# Patient Record
Sex: Female | Born: 1975 | Race: White | Hispanic: No | Marital: Single | Smoking: Never smoker
Health system: Southern US, Community
[De-identification: ages and names within clinical notes are randomized; demographics above are authoritative.]

## PROBLEM LIST (undated history)

## (undated) DIAGNOSIS — IMO0002 Reserved for concepts with insufficient information to code with codable children: Secondary | ICD-10-CM

## (undated) DIAGNOSIS — F32A Depression, unspecified: Secondary | ICD-10-CM

## (undated) DIAGNOSIS — F329 Major depressive disorder, single episode, unspecified: Secondary | ICD-10-CM

## (undated) DIAGNOSIS — I10 Essential (primary) hypertension: Secondary | ICD-10-CM

## (undated) DIAGNOSIS — F419 Anxiety disorder, unspecified: Secondary | ICD-10-CM

## (undated) DIAGNOSIS — N920 Excessive and frequent menstruation with regular cycle: Secondary | ICD-10-CM

## (undated) DIAGNOSIS — E669 Obesity, unspecified: Secondary | ICD-10-CM

## (undated) HISTORY — DX: Anxiety disorder, unspecified: F41.9

## (undated) HISTORY — PX: CYST EXCISION: SHX5701

## (undated) HISTORY — DX: Major depressive disorder, single episode, unspecified: F32.9

## (undated) HISTORY — DX: Reserved for concepts with insufficient information to code with codable children: IMO0002

## (undated) HISTORY — DX: Excessive and frequent menstruation with regular cycle: N92.0

## (undated) HISTORY — DX: Depression, unspecified: F32.A

## (undated) HISTORY — PX: ENDOMETRIAL ABLATION: SHX621

## (undated) HISTORY — DX: Essential (primary) hypertension: I10

## (undated) HISTORY — DX: Morbid (severe) obesity due to excess calories: E66.01

## (undated) HISTORY — DX: Obesity, unspecified: E66.9

## (undated) HISTORY — PX: LAPAROSCOPIC GASTRIC SLEEVE RESECTION: SHX5895

---

## 2012-07-29 HISTORY — PX: ENDOMETRIAL ABLATION: SHX621

## 2012-07-29 HISTORY — PX: TUBAL LIGATION: SHX77

## 2014-06-09 DIAGNOSIS — E668 Other obesity: Secondary | ICD-10-CM | POA: Insufficient documentation

## 2014-08-26 ENCOUNTER — Ambulatory Visit (INDEPENDENT_AMBULATORY_CARE_PROVIDER_SITE_OTHER): Payer: BLUE CROSS/BLUE SHIELD | Admitting: Advanced Practice Midwife

## 2014-08-26 ENCOUNTER — Encounter: Payer: Self-pay | Admitting: Advanced Practice Midwife

## 2014-08-26 VITALS — BP 127/85 | HR 96 | Resp 16 | Ht 62.0 in | Wt 231.0 lb

## 2014-08-26 DIAGNOSIS — Z124 Encounter for screening for malignant neoplasm of cervix: Secondary | ICD-10-CM

## 2014-08-26 DIAGNOSIS — Z01419 Encounter for gynecological examination (general) (routine) without abnormal findings: Secondary | ICD-10-CM

## 2014-08-26 DIAGNOSIS — Z8619 Personal history of other infectious and parasitic diseases: Secondary | ICD-10-CM

## 2014-08-26 DIAGNOSIS — E668 Other obesity: Secondary | ICD-10-CM

## 2014-08-26 DIAGNOSIS — Z1151 Encounter for screening for human papillomavirus (HPV): Secondary | ICD-10-CM

## 2014-08-26 NOTE — Patient Instructions (Signed)
Human Papillomavirus Human papillomavirus (HPV) is the most common sexually transmitted infection (STI) and is highly contagious. HPV infections cause genital warts and cancers to the outlet of the womb (cervix), birth canal (vagina), opening of the birth canal (vulva), and anus. There are over 100 types of HPV. Four types of HPV are responsible for causing 70% of all cervical cancers. Ninety percent of anal cancers and genital warts are caused by HPV. Unless you have wart-like lesions in the throat or genital warts that you can see or feel, HPV usually does not cause symptoms. Therefore, people can be infected for long periods and pass it on to others without knowing it. HPV in pregnancy usually does not cause a problem for the mother or baby. If the mother has genital warts, the baby rarely gets infected. When the HPV infection is found to be pre-cancerous on the cervix, vagina, or vulva, the mother will be followed closely during the pregnancy. Any needed treatment will be done after the baby is born. CAUSES   Having unprotected sex. HPV can be spread by oral, vaginal, or anal sexual activity.  Having several sex partners.  Having a sex partner who has other sex partners.  Having or having had another sexually transmitted infection. SYMPTOMS   More than 90% of people carrying HPV cannot tell anything is wrong.  Wart-like lesions in the throat (from having oral sex).  Warts in the infected skin or mucous membranes.  Genital warts may itch, burn, or bleed.  Genital warts may be painful or bleed during sexual intercourse. DIAGNOSIS   Genital warts are easily seen with the naked eye.  Currently, there is no FDA-approved test to detect HPV in males.  In females, a Pap test can show cells which are infected with HPV.  In females, a scope can be used to view the cervix (colposcopy). A colposcopy can be performed if the pelvic exam or Pap test is abnormal.  In females, a sample of tissue  may be removed (biopsy) during the colposcopy. TREATMENT   Treatment of genital warts can include:  Podophyllin lotion or gel.  Bichloroacetic acid (BCA) or trichloroacetic acid (TCA).  Podofilox solution or gel.  Imiquimod cream.  Interferon injections.  Use of a probe to apply extreme cold (cryotherapy).  Application of an intense beam of light (laser treatment).  Use of a probe to apply extreme heat (electrocautery).  Surgery.  HPV of the cervix, vagina, or vulva can be treated with:  Cryotherapy.  Laser treatment.  Electrocautery.  Surgery. Your caregiver will follow you closely after you are treated. This is because the HPV can come back and may need treatment again. HOME CARE INSTRUCTIONS   Follow your caregiver's instructions regarding medications, Pap tests, and follow-up exams.  Do not touch or scratch the warts.  Do not treat genital warts with medication used for treating hand warts.  Tell your sex partner about your infection because he or she may also need treatment.  Do not have sex while you are being treated.  After treatment, use condoms during sex to prevent future infections.  Have only 1 sex partner.  Have a sex partner who does not have other sex partners.  Use over-the-counter creams for itching or irritation as directed by your caregiver.  Use over-the-counter or prescription medicines for pain, discomfort, or fever as directed by your caregiver.  Do not douche or use tampons during treatment of HPV. PREVENTION   Talk to your caregiver about getting the HPV   vaccines. These vaccines prevent some HPV infections and cancers. It is recommended that the vaccine be given to males and females between the age of 26 and 17 years old. It will not work if you already have HPV and it is not recommended for pregnant women. The vaccines are not recommended for pregnant women.  Call your caregiver if you think you are pregnant and have the HPV.  A  PAP test is done to screen for cervical cancer.  The first PAP test should be done at age 31.  Between ages 29 and 65, PAP tests are repeated every 2 years.  Beginning at age 40, you are advised to have a PAP test every 3 years as long as your past 3 PAP tests have been normal.  Some women have medical problems that increase the chance of getting cervical cancer. Talk to your caregiver about these problems. It is especially important to talk to your caregiver if a new problem develops soon after your last PAP test. In these cases, your caregiver may recommend more frequent screening and Pap tests.  The above recommendations are the same for women who have or have not gotten the vaccine for HPV (Human Papillomavirus).  If you had a hysterectomy for a problem that was not a cancer or a condition that could lead to cancer, then you no longer need Pap tests. However, even if you no longer need a PAP test, a regular exam is a good idea to make sure no other problems are starting.   If you are between ages 25 and 71, and you have had normal Pap tests going back 10 years, you no longer need Pap tests. However, even if you no longer need a PAP test, a regular exam is a good idea to make sure no other problems are starting.  If you have had past treatment for cervical cancer or a condition that could lead to cancer, you need Pap tests and screening for cancer for at least 20 years after your treatment.  If Pap tests have been discontinued, risk factors (such as a new sexual partner)need to be re-assessed to determine if screening should be resumed.  Some women may need screenings more often if they are at high risk for cervical cancer. SEEK MEDICAL CARE IF:   The treated skin becomes red, swollen or painful.  You have an oral temperature above 102 F (38.9 C).  You feel generally ill.  You feel lumps or pimple-like projections in and around your genital area.  You develop bleeding of the  vagina or the treatment area.  You develop painful sexual intercourse. Document Released: 10/05/2003 Document Revised: 10/07/2011 Document Reviewed: 10/20/2013 [facility] Patient Information 2015 [place], [place]. This information is not intended to replace advice given to you by your health care provider. Make sure you discuss any questions you have with your health care provider.  Mammography Mammography is an X-ray of the breasts to look for changes that are not normal. The X-ray image is called a mammogram. This procedure can screen for breast cancer, can detect cancer early, and can diagnose cancer.  LET YOUR CAREGIVER KNOW ABOUT:  Breast implants.  Previous breast disease, biopsy, or surgery.  If you are breastfeeding.  Medicines taken, including vitamins, herbs, eyedrops, over-the-counter medicines, and creams.  Use of steroids (by mouth or creams).  Possibility of pregnancy, if this applies. RISKS AND COMPLICATIONS  Exposure to radiation, but at very low levels.  The results may be misinterpreted.  The  results may not be accurate.  Mammography may lead to further tests.  Mammography may not catch certain cancers. BEFORE THE PROCEDURE  Schedule your test about 7 days after your menstrual period. This is when your breasts are the least tender and have signs of hormone changes.  If you have had a mammography done at a different facility in the past, get the mammogram X-rays or have them sent to your current exam facility in order to compare them.  Wash your breasts and under your arms the day of the test.  Do not wear deodorants, perfumes, or powders anywhere on your body.  Wear clothes that you can change in and out of easily. PROCEDURE Relax as much as possible during the test. Any discomfort during the test will be very brief. The test should take less than 30 minutes. The following will happen:  You will undress from the waist up and put on a gown.  You will  stand in front of the X-ray machine.  Each breast will be placed between 2 plastic or glass plates. The plates will compress your breast for a few seconds.  X-rays will be taken from different angles of the breast. AFTER THE PROCEDURE  The mammogram will be examined.  Depending on the quality of the images, you may need to repeat certain parts of the test.  Ask when your test results will be ready. Make sure you get your test results.  You may resume normal activities. Document Released: 07/12/2000 Document Revised: 10/07/2011 Document Reviewed: 05/05/2011 [facility] Patient Information 2015 [place]. This information is not intended to replace advice given to you by your health care provider. Make sure you discuss any questions you have with your health care provider.

## 2014-08-27 NOTE — Progress Notes (Signed)
Patient ID: Krystal BoatmanAmber Perez, female   DOB: 01/10/1976, 39 y.o.   MRN: [id number]  HPI Krystal Perez is a 39 y.o. female. Here for annual Gyn exam and Pap. 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 no other history of abnormal Paps. No history of colposcopy or cervical procedures.    Has not had regular menstrual cycles since endometrial ablation. Only has occasional episodes of light spotting. Last episode of spotting 3 weeks ago.  Tubal ligation for birth control.  Planning bariatric surgery April 2016.  HPI Past Medical History  Diagnosis Date  . HPV test positive   . Menorrhagia     Past Surgical History  Procedure Laterality Date  . Endometrial ablation  2014  . Tubal ligation  2014    Family History  Problem Relation Age of Onset  . Hypertension Mother   . Diabetes Mother   . Hyperlipidemia Mother     Social History History  Substance Use Topics  . Smoking status: Never Smoker   . Smokeless tobacco: Not on file  . Alcohol Use: 0.0 oz/week    0 Not specified per week     Comment: Occasionally    No Known Allergies  Current Outpatient Prescriptions  Medication Sig Dispense Refill  . busPIRone (BUSPAR) 15 MG tablet TAKE 1 TABLET BY MOUTH TWICE DAILY AS NEEDED FOR ANXIETY    . citalopram (CELEXA) 20 MG tablet Take 20 mg by mouth.    . eszopiclone (LUNESTA) 2 MG TABS tablet Take 2 mg by mouth.    Marland Kitchen. ibuprofen (ADVIL,MOTRIN) 800 MG tablet TAKE 1 TABLET BY MOUTH EVERY 6 HOURS AS NEEDED FOR PAIN    . lisinopril (PRINIVIL,ZESTRIL) 10 MG tablet Take 10 mg by mouth.     No current facility-administered medications for this visit.    Review of Systems Review of Systems  Constitutional: Negative for appetite change, fatigue and unexpected weight change (has lost weight on purpose through changes in diet and increase exercise.).  Respiratory: Negative for shortness of breath.   Cardiovascular: Negative for chest pain and palpitations.  Gastrointestinal: Negative for abdominal pain,  diarrhea and constipation.  Endocrine: Negative for cold intolerance and heat intolerance.  Genitourinary: Negative for vaginal bleeding, vaginal discharge, menstrual problem and pelvic pain.    Blood pressure 127/85, pulse 96, resp. rate 16, height 5\' 2"  (1.575 m), weight 104.781 kg (231 lb).  Physical Exam Physical Exam  Constitutional: She is oriented to person, place, and time. She appears well-developed and well-nourished. No distress.  HENT:  Head: Normocephalic.  Eyes: Conjunctivae are normal.  Neck: Thyromegaly (? Slight thyromegaly, exam limited by body habitus) present.  Cardiovascular: Normal rate, regular rhythm and normal heart sounds.   No murmur heard. Pulmonary/Chest: Effort normal and breath sounds normal. No respiratory distress.  Abdominal: Soft. Bowel sounds are normal. She exhibits no distension and no mass. There is no tenderness. There is no rebound.  Genitourinary: Vagina normal and uterus normal. No vaginal discharge found.  Musculoskeletal: Normal range of motion. She exhibits no edema or tenderness.  Neurological: She is alert and oriented to person, place, and time.  Skin: Skin is warm and dry.  Psychiatric: She has a normal mood and affect.   Assessment/Plan 1. History of HPV infection  - Cytology - PAP with HPV co-testing  2. Screening for malignant neoplasm of cervix  - Cytology - PAP  3.  Obesity  - Recommend a TSH. Patient declines because she believes it will be drawn with  blood work scheduled at bariatric clinic. - Follow-up in 1 year HPV handout given   Dorathy Kinsman 08/27/2014, 2:01 PM

## 2014-08-30 LAB — CYTOLOGY - PAP

## 2014-10-05 DIAGNOSIS — Z903 Acquired absence of stomach [part of]: Secondary | ICD-10-CM | POA: Insufficient documentation

## 2017-05-30 ENCOUNTER — Ambulatory Visit: Payer: BLUE CROSS/BLUE SHIELD | Admitting: Physician Assistant

## 2017-06-02 ENCOUNTER — Ambulatory Visit (INDEPENDENT_AMBULATORY_CARE_PROVIDER_SITE_OTHER): Payer: 59 | Admitting: Physician Assistant

## 2017-06-02 ENCOUNTER — Encounter: Payer: Self-pay | Admitting: Physician Assistant

## 2017-06-02 VITALS — BP 133/85 | HR 81 | Temp 98.2°F | Resp 18 | Ht 62.0 in | Wt 179.2 lb

## 2017-06-02 DIAGNOSIS — Z130 Encounter for screening for diseases of the blood and blood-forming organs and certain disorders involving the immune mechanism: Secondary | ICD-10-CM | POA: Diagnosis not present

## 2017-06-02 DIAGNOSIS — Z1231 Encounter for screening mammogram for malignant neoplasm of breast: Secondary | ICD-10-CM

## 2017-06-02 DIAGNOSIS — Z23 Encounter for immunization: Secondary | ICD-10-CM | POA: Diagnosis not present

## 2017-06-02 DIAGNOSIS — Z7689 Persons encountering health services in other specified circumstances: Secondary | ICD-10-CM | POA: Diagnosis not present

## 2017-06-02 DIAGNOSIS — Z131 Encounter for screening for diabetes mellitus: Secondary | ICD-10-CM | POA: Diagnosis not present

## 2017-06-02 DIAGNOSIS — I10 Essential (primary) hypertension: Secondary | ICD-10-CM

## 2017-06-02 DIAGNOSIS — R002 Palpitations: Secondary | ICD-10-CM | POA: Diagnosis not present

## 2017-06-02 DIAGNOSIS — Z13220 Encounter for screening for lipoid disorders: Secondary | ICD-10-CM | POA: Diagnosis not present

## 2017-06-02 DIAGNOSIS — Z1329 Encounter for screening for other suspected endocrine disorder: Secondary | ICD-10-CM

## 2017-06-02 DIAGNOSIS — F418 Other specified anxiety disorders: Secondary | ICD-10-CM | POA: Diagnosis not present

## 2017-06-02 MED ORDER — BUSPIRONE HCL 5 MG PO TABS: 5.0000 mg | ORAL_TABLET | Freq: Two times a day (BID) | ORAL | 2 refills | Status: DC | PRN

## 2017-06-02 MED ORDER — FLUOXETINE HCL 20 MG PO TABS: 20.0000 mg | ORAL_TABLET | Freq: Every day | ORAL | 3 refills | Status: DC

## 2017-06-02 NOTE — Progress Notes (Signed)
HPI:                                                                Krystal Perez is a 41 y.o. female [facility] today to establish care  Depression/Anxiety: has been on Celexa and Fluoxetine in the past. Not currently taking any medication. Endorses depressed mood, anhedonia, over-eating, low self-esteem. Also reports increased anxiety, trouble relaxing and irritability. Denies sleep disturbance; typically falls asleep at 10pm and rises at 6:30am, no nighttime awakenings. She is currently single, raising 3 children, working full-time and going to school full-time. Denies symptoms of mania/hypomania. Denies suicidal thinking. Denies auditory/visual hallucinations. Previous medications: Lexapro, Celexa (side effects), Prozac (helpful), Xanax, Buspar, Lunesta  Patient reports concern about her heart rate for the last 1-2 months. She has noted that her resting HR has been 100-105 at times. Reports associated palpitations and feeling "shaky." Sometimes she feels anxious during these episodes. Palpitations last minutes to 1 hour. Denies chest pain with exertion, orthopnea, decreased exercise tolerance. She reports some dyspnea on exertion, but this is mainly at the beginning of jogging and improves in minutes. She is currently exercising 3-4 days/week for 1 hour. Has noted approximately 40 pound weight gain over the last 2 years; she had a sleeve gastrectomy 3 years ago. Denies family history of heart disease.   Past Medical History:  Diagnosis Date  . Anxiety   . Depression   . HPV test positive   . Menorrhagia   . Morbid obesity (HCC)   . Obesity    Past Surgical History:  Procedure Laterality Date  . CYST EXCISION    . ENDOMETRIAL ABLATION  2014  . ENDOMETRIAL ABLATION    . LAPAROSCOPIC GASTRIC SLEEVE RESECTION    . TUBAL LIGATION  2014   Social History   Tobacco Use  . Smoking status: Never Smoker  . Smokeless tobacco:  Never Used  Substance Use Topics  . Alcohol use: Yes    Alcohol/week: 0.0 oz    Comment: Occasionally   family history includes Cancer in her maternal grandfather; Diabetes in her maternal grandfather and mother; Hyperlipidemia in her mother; Hypertension in her father and mother.  ROS: Review of Systems  Constitutional: Negative.   HENT: Negative.   Eyes: Negative.   Respiratory: Negative for cough and wheezing.        + DOE  Cardiovascular: Positive for palpitations and leg swelling (left > right). Negative for chest pain, claudication and PND.  Gastrointestinal: Negative.   Genitourinary: Negative.   Musculoskeletal: Negative.   Skin: Negative.   Neurological: Negative.   Endo/Heme/Allergies:       + polyphagia  Psychiatric/Behavioral: Positive for depression. The patient is nervous/anxious. The patient does not have insomnia.      Medications: Current Outpatient Medications  Medication Sig Dispense Refill  . lisinopril (PRINIVIL,ZESTRIL) 10 MG tablet Take 10 mg by mouth.     No current facility-administered medications for this visit.    No Known Allergies     Objective:  BP 136/83   Pulse 87   Temp 98.2 F (36.8 C)   Resp 18   Ht 5\' 2"  (1.575 m)   Wt 179 lb 4 oz (81.3 kg)   SpO2  100%   BMI 32.79 kg/m  Gen:  alert, not ill-appearing, no distress, appropriate for age, obese female HEENT: head normocephalic without obvious abnormality, conjunctiva and cornea clear, trachea midline Pulm: Normal work of breathing, normal phonation, clear to auscultation bilaterally, no wheezes, rales or rhonchi CV: Normal rate, regular rhythm, s1 and s2 distinct, no murmurs, clicks or rubs  Neuro: alert and oriented x 3, no tremor MSK: extremities atraumatic, normal gait and station Skin: intact, no rashes on exposed skin, no jaundice, no cyanosis Psych: well-groomed, cooperative, good eye contact, euthymic mood, affect mood-congruent, speech is articulate, and thought  processes clear and goal-directed  Depression screen PHQ 2/9 06/02/2017  Decreased Interest 1  Down, Depressed, Hopeless 2  PHQ - 2 Score 3  Altered sleeping 1  Tired, decreased energy 1  Change in appetite 1  Feeling bad or failure about yourself  2  Trouble concentrating 1  Moving slowly or fidgety/restless 0  Suicidal thoughts 0  PHQ-9 Score 9   GAD 7 : Generalized Anxiety Score 06/02/2017  Nervous, Anxious, on Edge 3  Control/stop worrying 1  Worry too much - different things 1  Trouble relaxing 2  Restless 1  Easily annoyed or irritable 3  Afraid - awful might happen 0  Total GAD 7 Score 11     No results found for this or any previous visit (from the past 72 hour(s)). No results found.    Assessment and Plan: 41 y.o. female with   1. Encounter to establish care - reviewed PMH, PSH, PFH, medications and allergies - reviewed health maintenance - mammogram ordered today - Pap smear UTD - influenza given today  2. Mixed anxiety and depressive disorder - GAD7 11, moderate; PHQ9 score 9, moderate - starting Fluoxetine, per patient request for this medication. She finds xanax too sedating, so will trial Buspar for breakthrough anxiety. Follow-up in 4 weeks - FLUoxetine (PROZAC) 20 MG tablet; Take 1 tablet (20 mg total) daily by mouth.  Dispense: 30 tablet; Refill: 3 - busPIRone (BUSPAR) 5 MG tablet; Take 1 tablet (5 mg total) 2 (two) times daily as needed by mouth.  Dispense: 60 tablet; Refill: 2  3. Hypertension goal BP (blood pressure) < 130/80 BP Readings from Last 3 Encounters:  06/02/17 133/85  08/26/14 127/85  - counseled on therapeutic lifestyle changes - consider re-starting Lisinopril if other CVD risk factors or BP consistently elevated despite TLC   4. Screening for blood disease - CBC  5. Screening for diabetes mellitus - Comprehensive metabolic panel  6. Encounter for screening for lipid disorder - Lipid Panel w/reflex Direct LDL  7.  Screening for thyroid disorder - TSH - T3, free - T4, free  8. Intermittent palpitations - no tachycardia on exam today, checking labs to r/o anemia/thyroid disorder, limit caffeine, work on hydration, diary of episodes, manage anxiety, close follow-up in 4 weeks. Will consider referral to cardiology or cardiac event monitor if episodes persists despite interventions. Episodes do not have features of SVT  - CBC - TSH - T3, free - T4, free  9. Need for immunization against influenza - Flu Vaccine QUAD 36+ mos IM  10. Breast cancer screening by mammogram - MM SCREENING BREAST TOMO BILATERAL; Future  Patient education and anticipatory guidance given Patient agrees with treatment plan Follow-up in 4 weeks or sooner as needed if symptoms worsen or fail to improve  Levonne Hubert PA-C

## 2017-06-02 NOTE — Patient Instructions (Addendum)
For anxiety/mood: - Start Fluoxetine 1/2 tab daily for 3 days, then increase to full tab daily - Start Buspar 1 tablet two times daily as needed for anxiety - Follow-up in 4 weeks  For your blood pressure: - Check blood pressure at home for the next 2 weeks - Check around the same time each day in a relaxed setting - Limit salt to <2000 mg/day - Follow DASH eating plan - limit alcohol to 1 standard drink per day - avoid tobacco products - weight loss: 7% of current body weight  For palpitations: - limit caffeinated beverages to 1 cup per day - make sure you are drinking at least 1L of water daily; make sure you stay hydrated during and following exercise - keep a diary of your episodes. Include context, what you ate/drank, how long it lasted, anything that helped to relieve it  Living With Anxiety After being diagnosed with an anxiety disorder, you may be relieved to know why you have felt or behaved a certain way. It is natural to also feel overwhelmed about the treatment ahead and what it will mean for your life. With care and support, you can manage this condition and recover from it. How to cope with anxiety Dealing with stress Stress is your body's reaction to life changes and events, both good and bad. Stress can last just a few hours or it can be ongoing. Stress can play a major role in anxiety, so it is important to learn both how to cope with stress and how to think about it differently. Talk with your health care provider or a counselor to learn more about stress reduction. He or she may suggest some stress reduction techniques, such as:  Music therapy. This can include creating or listening to music that you enjoy and that inspires you.  Mindfulness-based meditation. This involves being aware of your normal breaths, rather than trying to control your breathing. It can be done while sitting or walking.  Centering prayer. This is a kind of meditation that involves focusing on a  word, phrase, or sacred image that is meaningful to you and that brings you peace.  Deep breathing. To do this, expand your stomach and inhale slowly through your nose. Hold your breath for 3-5 seconds. Then exhale slowly, allowing your stomach muscles to relax.  Self-talk. This is a skill where you identify thought patterns that lead to anxiety reactions and correct those thoughts.  Muscle relaxation. This involves tensing muscles then relaxing them.  Choose a stress reduction technique that fits your lifestyle and personality. Stress reduction techniques take time and practice. Set aside 5-15 minutes a day to do them. Therapists can offer training in these techniques. The training may be covered by some insurance plans. Other things you can do to manage stress include:  Keeping a stress diary. This can help you learn what triggers your stress and ways to control your response.  Thinking about how you respond to certain situations. You may not be able to control everything, but you can control your reaction.  Making time for activities that help you relax, and not feeling guilty about spending your time in this way.  Therapy combined with coping and stress-reduction skills provides the best chance for successful treatment. Medicines Medicines can help ease symptoms. Medicines for anxiety include:  Anti-anxiety drugs.  Antidepressants.  Beta-blockers.  Medicines may be used as the main treatment for anxiety disorder, along with therapy, or if other treatments are not working. Medicines should  be prescribed by a health care provider. Relationships Relationships can play a big part in helping you recover. Try to spend more time connecting with trusted friends and family members. Consider going to couples counseling, taking family education classes, or going to family therapy. Therapy can help you and others better understand the condition. How to recognize changes in your  condition Everyone has a different response to treatment for anxiety. Recovery from anxiety happens when symptoms decrease and stop interfering with your daily activities at home or work. This may mean that you will start to:  Have better concentration and focus.  Sleep better.  Be less irritable.  Have more energy.  Have improved memory.  It is important to recognize when your condition is getting worse. Contact your health care provider if your symptoms interfere with home or work and you do not feel like your condition is improving. Where to find help and support: You can get help and support from these sources:  Self-help groups.  Online and [employer] organizations.  A trusted spiritual leader.  Couples counseling.  Family education classes.  Family therapy.  Follow these instructions at home:  Eat a healthy diet that includes plenty of vegetables, fruits, whole grains, low-fat dairy products, and lean protein. Do not eat a lot of foods that are high in solid fats, added sugars, or salt.  Exercise. Most adults should do the following: ? Exercise for at least 150 minutes each week. The exercise should increase your heart rate and make you sweat (moderate-intensity exercise). ? Strengthening exercises at least twice a week.  Cut down on caffeine, tobacco, alcohol, and other potentially harmful substances.  Get the right amount and quality of sleep. Most adults need 7-9 hours of sleep each night.  Make choices that simplify your life.  Take over-the-counter and prescription medicines only as told by your health care provider.  Avoid caffeine, alcohol, and certain over-the-counter cold medicines. These may make you feel worse. Ask your pharmacist which medicines to avoid.  Keep all follow-up visits as told by your health care provider. This is important. Questions to ask your health care provider  Would I benefit from therapy?  How often should I follow up with a  health care provider?  How long do I need to take medicine?  Are there any long-term side effects of my medicine?  Are there any alternatives to taking medicine? Contact a health care provider if:  You have a hard time staying focused or finishing daily tasks.  You spend many hours a day feeling worried about everyday life.  You become exhausted by worry.  You start to have headaches, feel tense, or have nausea.  You urinate more than normal.  You have diarrhea. Get help right away if:  You have a racing heart and shortness of breath.  You have thoughts of hurting yourself or others. If you ever feel like you may hurt yourself or others, or have thoughts about taking your own life, get help right away. You can go to your nearest emergency department or call:  Your local emergency services (911 in the [place]  A suicide crisis helpline, such as the National Suicide Prevention Lifeline at 704-816-72591-312-532-9472. This is open 24-hours a day.  Summary  Taking steps to deal with stress can help calm you.  Medicines cannot cure anxiety disorders, but they can help ease symptoms.  Family, friends, and partners can play a big part in helping you recover from an anxiety disorder. This information  is not intended to replace advice given to you by your health care provider. Make sure you discuss any questions you have with your health care provider. Document Released: 07/09/2016 Document Revised: 07/09/2016 Document Reviewed: 07/09/2016 Elsevier Interactive Patient Education  [employer].

## 2017-07-02 ENCOUNTER — Encounter: Payer: 59 | Admitting: Physician Assistant

## 2017-07-02 DIAGNOSIS — Z0189 Encounter for other specified special examinations: Secondary | ICD-10-CM

## 2017-07-03 ENCOUNTER — Ambulatory Visit: Payer: 59

## 2017-07-10 ENCOUNTER — Telehealth: Payer: Self-pay | Admitting: *Deleted

## 2017-07-10 NOTE — Telephone Encounter (Signed)
Patient called and states the Buspar does not seem to help at all with as needed anxiety. She and the provider had discussed taking a medication for as needed use for anxiety that would be less likely to cause drowsiness. Patient is overdue for her four week follow appointment for this issue. She does have an appointment scheduled in Jan for her CPE. I did advise her that a separate appointment should be scheduled to discuss this current issue as insurance will not pay for both the CPE and other problems discussed.Patient voiced understanding. She wasn't sure if she would be able to come in before her scheduled appointment in January. Please advise

## 2017-07-10 NOTE — Telephone Encounter (Signed)
Is she taking the Fluoxetine? I would recommend increasing this to 40 mg daily As far as the Buspar, she is on the lowest possible dose. I would increase her to 2 tablets up to 3 times daily for breakthrough anxiety There are not any as needed medications that don't have the potential to be sedating If she can come in for an appointment we can discuss a beta blocker, but I need to see her vital signs and take a cardiac history before doing this. These are the best recommendations I can make without seeing her in the office.

## 2017-07-11 NOTE — Telephone Encounter (Signed)
Called and left message for patient to call back.

## 2017-08-13 ENCOUNTER — Encounter: Payer: 59 | Admitting: Physician Assistant

## 2017-08-13 ENCOUNTER — Ambulatory Visit: Payer: 59

## 2017-10-07 ENCOUNTER — Encounter: Payer: Self-pay | Admitting: Physician Assistant

## 2017-10-07 ENCOUNTER — Ambulatory Visit (INDEPENDENT_AMBULATORY_CARE_PROVIDER_SITE_OTHER): Payer: 59 | Admitting: Physician Assistant

## 2017-10-07 VITALS — BP 121/81 | HR 80 | Wt 175.0 lb

## 2017-10-07 DIAGNOSIS — R002 Palpitations: Secondary | ICD-10-CM | POA: Diagnosis not present

## 2017-10-07 DIAGNOSIS — Z23 Encounter for immunization: Secondary | ICD-10-CM | POA: Diagnosis not present

## 2017-10-07 DIAGNOSIS — F418 Other specified anxiety disorders: Secondary | ICD-10-CM

## 2017-10-07 MED ORDER — FLUOXETINE HCL 20 MG PO TABS: 20.0000 mg | ORAL_TABLET | Freq: Every day | ORAL | 1 refills | Status: DC

## 2017-10-07 MED ORDER — BUSPIRONE HCL 10 MG PO TABS: 10.0000 mg | ORAL_TABLET | Freq: Three times a day (TID) | ORAL | 3 refills | Status: DC | PRN

## 2017-10-07 MED ORDER — ATENOLOL 25 MG PO TABS: 25.0000 mg | ORAL_TABLET | Freq: Every day | ORAL | 1 refills | Status: DC

## 2017-10-07 NOTE — Progress Notes (Signed)
HPI:                                                                Krystal Perez is a 42 y.o. female who presents to [name]: [facility] today for medication management  Depression/Anxiety: she was started on Fluoxetine and Buspar prn approximately 4 months ago. She contacted the office requesting a medication change because she did not feel the Buspar was working. She is currently taking Fluoxetine 20 mg at nighttime. Reports parenting is her main stressor and anxiety seems to be worse in the evenings. She has 2 daughters living with her and 2 sons living with their father. Denies symptoms of mania/hypomania. Denies suicidal thinking. Denies auditory/visual hallucinations. Prior meds for sleep: Melatonin - weird dreams, Zquil - chest discomfort  Depression screen [facility] 2/9 10/07/2017 06/02/2017  Decreased Interest 0 1  Down, Depressed, Hopeless 0 2  PHQ - 2 Score 0 3  Altered sleeping 1 1  Tired, decreased energy 1 1  Change in appetite 0 1  Feeling bad or failure about yourself  0 2  Trouble concentrating 2 1  Moving slowly or fidgety/restless 0 0  Suicidal thoughts 0 0  PHQ-9 Score 4 9  Difficult doing work/chores Somewhat difficult -    GAD 7 : Generalized Anxiety Score 10/07/2017 06/02/2017  Nervous, Anxious, on Edge 1 3  Control/stop worrying 1 1  Worry too much - different things 1 1  Trouble relaxing 1 2  Restless 1 1  Easily annoyed or irritable 1 3  Afraid - awful might happen 0 0  Total GAD 7 Score 6 11  Anxiety Difficulty Somewhat difficult -      Past Medical History:  Diagnosis Date  . Anxiety   . Depression   . HPV test positive   . Hypertension   . Menorrhagia   . Morbid obesity (HCC)   . Obesity    Past Surgical History:  Procedure Laterality Date  . CYST EXCISION    . ENDOMETRIAL ABLATION  2014  . ENDOMETRIAL ABLATION    . LAPAROSCOPIC GASTRIC SLEEVE RESECTION    . TUBAL LIGATION  2014   Social History    Tobacco Use  . Smoking status: Never Smoker  . Smokeless tobacco: Never Used  Substance Use Topics  . Alcohol use: Yes    Alcohol/week: 0.0 oz    Comment: Occasionally   family history includes Cancer in her maternal grandfather; Diabetes in her maternal grandfather and mother; Hyperlipidemia in her mother; Hypertension in her father and mother.    ROS: negative except as noted in the HPI  Medications: Current Outpatient Medications  Medication Sig Dispense Refill  . FLUoxetine (PROZAC) 20 MG tablet Take 1 tablet (20 mg total) by mouth daily. 90 tablet 1  . atenolol (TENORMIN) 25 MG tablet Take 1 tablet (25 mg total) by mouth daily. 90 tablet 1  . busPIRone (BUSPAR) 10 MG tablet Take 1 tablet (10 mg total) by mouth 3 (three) times daily as needed. 60 tablet 3   No current facility-administered medications for this visit.    Allergies  Allergen Reactions  . Celexa [Citalopram Hydrobromide] Other (See Comments)       Objective:  BP 121/81   Pulse 80  Wt 175 lb (79.4 kg)   LMP 09/30/2017   BMI 32.01 kg/m  Gen:  alert, not ill-appearing, no distress, appropriate for age, obese female HEENT: head normocephalic without obvious abnormality, conjunctiva and cornea clear, trachea midline Pulm: Normal work of breathing, normal phonation, clear to auscultation bilaterally, no wheezes, rales or rhonchi CV: Normal rate, regular rhythm, s1 and s2 distinct, no murmurs, clicks or rubs  Neuro: alert and oriented x 3, no tremor MSK: extremities atraumatic, normal gait and station Skin: intact, no rashes on exposed skin, no jaundice, no cyanosis Psych: well-groomed, cooperative, good eye contact, euthymic mood, affect mood-congruent, speech is articulate, and thought processes clear and goal-directed    No results found for this or any previous visit (from the past 72 hour(s)). No results found.    Assessment and Plan: 42 y.o. female with   1. 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 Having a good response to Fluoxetine. She is mainly concerned about over-sedation with anxiety medication. We will start a beta blocker at bedtime to try to control breakthrough anxiety - FLUoxetine (PROZAC) 20 MG tablet; Take 1 tablet (20 mg total) by mouth daily.  Dispense: 90 tablet; Refill: 1  2. Heart palpitations - atenolol (TENORMIN) 25 MG tablet; Take 1 tablet (25 mg total) by mouth daily.  Dispense: 90 tablet; Refill: 1   Mixed anxiety and depressive disorder - Plan: FLUoxetine (PROZAC) 20 MG tablet, busPIRone (BUSPAR) 10 MG tablet  Heart palpitations - Plan: atenolol (TENORMIN) 25 MG tablet  Need for Tdap vaccination - Plan: Tdap vaccine greater than or equal to 7yo IM    Patient education and anticipatory guidance given Patient agrees with treatment plan Follow-up in 3 months or sooner as needed if symptoms worsen or fail to improve  Levonne Hubertharley E. Georgi Tuel PA-C

## 2017-10-07 NOTE — Patient Instructions (Addendum)
Call [phone] to schedule mammogram   

## 2018-01-08 ENCOUNTER — Ambulatory Visit: Payer: 59 | Admitting: Physician Assistant

## 2018-03-25 ENCOUNTER — Other Ambulatory Visit: Payer: Self-pay | Admitting: Physician Assistant

## 2018-03-25 DIAGNOSIS — F418 Other specified anxiety disorders: Secondary | ICD-10-CM

## 2018-04-08 ENCOUNTER — Other Ambulatory Visit: Payer: Self-pay | Admitting: Physician Assistant

## 2018-04-08 DIAGNOSIS — F418 Other specified anxiety disorders: Secondary | ICD-10-CM

## 2018-04-27 ENCOUNTER — Ambulatory Visit (INDEPENDENT_AMBULATORY_CARE_PROVIDER_SITE_OTHER): Payer: 59 | Admitting: Physician Assistant

## 2018-04-27 ENCOUNTER — Encounter: Payer: Self-pay | Admitting: Physician Assistant

## 2018-04-27 ENCOUNTER — Ambulatory Visit (INDEPENDENT_AMBULATORY_CARE_PROVIDER_SITE_OTHER): Payer: 59

## 2018-04-27 VITALS — BP 115/74 | HR 91 | Wt 183.0 lb

## 2018-04-27 DIAGNOSIS — Z9884 Bariatric surgery status: Secondary | ICD-10-CM

## 2018-04-27 DIAGNOSIS — F418 Other specified anxiety disorders: Secondary | ICD-10-CM

## 2018-04-27 DIAGNOSIS — M25462 Effusion, left knee: Secondary | ICD-10-CM

## 2018-04-27 DIAGNOSIS — M25561 Pain in right knee: Secondary | ICD-10-CM | POA: Diagnosis not present

## 2018-04-27 DIAGNOSIS — M25562 Pain in left knee: Secondary | ICD-10-CM | POA: Diagnosis not present

## 2018-04-27 DIAGNOSIS — Z23 Encounter for immunization: Secondary | ICD-10-CM | POA: Diagnosis not present

## 2018-04-27 DIAGNOSIS — M1712 Unilateral primary osteoarthritis, left knee: Secondary | ICD-10-CM | POA: Diagnosis not present

## 2018-04-27 MED ORDER — ESCITALOPRAM OXALATE 10 MG PO TABS: ORAL_TABLET | ORAL | 0 refills | Status: DC

## 2018-04-27 MED ORDER — DICLOFENAC SODIUM 1 % TD GEL: 4.0000 g | Freq: Four times a day (QID) | TRANSDERMAL | 2 refills | Status: DC

## 2018-04-27 MED ORDER — LORAZEPAM 0.5 MG PO TABS: 0.5000 mg | ORAL_TABLET | Freq: Three times a day (TID) | ORAL | 0 refills | Status: DC | PRN

## 2018-04-27 NOTE — Progress Notes (Signed)
HPI:                                                                Jericho Cieslik is a 42 y.o. female who presents to [name]: Primary Care [facility] today for medication management  Depression/Anxiety: she self-discontinued her Fluoxetine in early June. Reports sexual side effects, specifically anorgasmia, with Fluoxetine. She has been feeling more depressed and tearful since stopping the medication. She exercises 4-5 days per week, but is gaining weight. She recently got married on June 30th. Denies symptoms of mania/hypomania. Denies suicidal thinking. Denies auditory/visual hallucinations.  Knee pain: reports bilateral knee pain, described as achey, associated with grinding sensation and "tightness," gradually worsening over the last 2 months. Worse with jumping and squats.  She has had to reduce her physical activity at the gym. She adjusted her exercise routine, avoided provoking activities, and started leg strengthening. She has not tried any anti-inflammatories due to s/p gastric sleeve.  Depression screen [facility] 2/9 04/27/2018 10/07/2017 06/02/2017  Decreased Interest 1 0 1  Down, Depressed, Hopeless 2 0 2  PHQ - 2 Score 3 0 3  Altered sleeping 2 1 1   Tired, decreased energy 3 1 1   Change in appetite 3 0 1  Feeling bad or failure about yourself  1 0 2  Trouble concentrating 2 2 1   Moving slowly or fidgety/restless 0 0 0  Suicidal thoughts 0 0 0  PHQ-9 Score 14 4 9   Difficult doing work/chores Somewhat difficult Somewhat difficult -    GAD 7 : Generalized Anxiety Score 04/27/2018 10/07/2017 06/02/2017  Nervous, Anxious, on Edge 3 1 3   Control/stop worrying 2 1 1   Worry too much - different things 2 1 1   Trouble relaxing 1 1 2   Restless 2 1 1   Easily annoyed or irritable 3 1 3   Afraid - awful might happen 2 0 0  Total GAD 7 Score 15 6 11   Anxiety Difficulty Somewhat difficult Somewhat difficult -      Past Medical History:  Diagnosis Date  .  Anxiety   . Depression   . HPV test positive   . Hypertension   . Menorrhagia   . Morbid obesity (HCC)   . Obesity    Past Surgical History:  Procedure Laterality Date  . CYST EXCISION    . ENDOMETRIAL ABLATION  2014  . ENDOMETRIAL ABLATION    . LAPAROSCOPIC GASTRIC SLEEVE RESECTION    . TUBAL LIGATION  2014   Social History   Tobacco Use  . Smoking status: Never Smoker  . Smokeless tobacco: Never Used  Substance Use Topics  . Alcohol use: Yes    Alcohol/week: 0.0 standard drinks    Comment: Occasionally   family history includes Cancer in her maternal grandfather; Diabetes in her maternal grandfather and mother; Hyperlipidemia in her mother; Hypertension in her father and mother.    ROS: negative except as noted in the HPI  Medications: Current Outpatient Medications  Medication Sig Dispense Refill  . escitalopram (LEXAPRO) 10 MG tablet Take 0.5 tablets (5 mg total) by mouth at bedtime for 3 days, THEN 1 tablet (10 mg total) at bedtime for 27 days. 30 tablet 0   No current facility-administered medications for this visit.  Allergies  Allergen Reactions  . Celexa [Citalopram Hydrobromide] Other (See Comments)  . Fluoxetine Other (See Comments)    Sexual side effects       Objective:  BP 115/74   Pulse 91   Wt 183 lb (83 kg)   BMI 33.47 kg/m  Gen:  alert, not ill-appearing, no distress, appropriate for age, obese female HEENT: head normocephalic without obvious abnormality, conjunctiva and cornea clear, trachea midline Pulm: Normal work of breathing, normal phonation, clear to auscultation bilaterally, no wheezes, rales or rhonchi CV: Normal rate, regular rhythm, s1 and s2 distinct, no murmurs, clicks or rubs  Neuro: alert and oriented x 3, no tremor MSK: extremities atraumatic, normal gait and station Knees: atraumatic, bilateral crepitus, no palpable effusion, ROM intact,  no joint line tenderness, pain reproduced with flexion, negative McMurrays Skin:  intact, no rashes on exposed skin, no jaundice, no cyanosis Psych: well-groomed, cooperative, good eye contact, depressed mood, affect full range, speech is articulate, and thought processes clear and goal-directed    No results found for this or any previous visit (from the past 72 hour(s)). No results found.    Assessment and Plan: 43 y.o. female with   .Zareen was seen today for medication management.  Diagnoses and all orders for this visit:  Mixed anxiety and depressive disorder -     escitalopram (LEXAPRO) 10 MG tablet; Take 0.5 tablets (5 mg total) by mouth at bedtime for 3 days, THEN 1 tablet (10 mg total) at bedtime for 27 days. -     LORazepam (ATIVAN) 0.5 MG tablet; Take 1 tablet (0.5 mg total) by mouth every 8 (eight) hours as needed for anxiety.  Pain in both knees, unspecified chronicity -     DG Knee Complete 4 Views Left -     DG Knee Complete 4 Views Right -     diclofenac sodium (VOLTAREN) 1 % GEL; Apply 4 g topically 4 (four) times daily. To affected joint. -     Ambulatory referral to Physical Therapy  Need for immunization against influenza -     Flu Vaccine QUAD 36+ mos IM  Patellofemoral arthralgia of left knee -     Ambulatory referral to Physical Therapy -     meloxicam (MOBIC) 15 MG tablet; Take 1 tablet (15 mg total) by mouth daily.  Status post gastric bypass for obesity -     famotidine (PEPCID) 40 MG tablet; Take daily with anti-inflammatory to prevent gastric ulcer     Patient education and anticipatory guidance given Patient agrees with treatment plan Follow-up in 1 month or sooner as needed if symptoms worsen or fail to improve  Levonne Hubert PA-C

## 2018-04-27 NOTE — Patient Instructions (Addendum)
Check with your insurance company for coverage of Trintell[employer]   Call [phone] to schedule your mammogram

## 2018-04-27 NOTE — Progress Notes (Signed)
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

## 2018-04-28 MED ORDER — MELOXICAM 15 MG PO TABS: 15.0000 mg | ORAL_TABLET | Freq: Every day | ORAL | 0 refills | Status: DC

## 2018-04-28 MED ORDER — FAMOTIDINE 40 MG PO TABS: ORAL_TABLET | ORAL | 0 refills | Status: DC

## 2018-05-07 ENCOUNTER — Encounter: Payer: Self-pay | Admitting: Physician Assistant

## 2018-05-07 DIAGNOSIS — Z9884 Bariatric surgery status: Secondary | ICD-10-CM | POA: Insufficient documentation

## 2018-05-07 DIAGNOSIS — M25562 Pain in left knee: Secondary | ICD-10-CM | POA: Insufficient documentation

## 2018-05-18 ENCOUNTER — Encounter: Payer: Self-pay | Admitting: Family Medicine

## 2018-05-18 ENCOUNTER — Ambulatory Visit (INDEPENDENT_AMBULATORY_CARE_PROVIDER_SITE_OTHER): Payer: 59 | Admitting: Family Medicine

## 2018-05-18 VITALS — BP 130/83 | HR 72 | Ht 62.0 in | Wt 182.0 lb

## 2018-05-18 DIAGNOSIS — M25562 Pain in left knee: Secondary | ICD-10-CM

## 2018-05-18 DIAGNOSIS — M25561 Pain in right knee: Secondary | ICD-10-CM

## 2018-05-18 DIAGNOSIS — M1712 Unilateral primary osteoarthritis, left knee: Secondary | ICD-10-CM | POA: Diagnosis not present

## 2018-05-18 DIAGNOSIS — F418 Other specified anxiety disorders: Secondary | ICD-10-CM

## 2018-05-18 DIAGNOSIS — Z903 Acquired absence of stomach [part of]: Secondary | ICD-10-CM

## 2018-05-18 MED ORDER — OMEPRAZOLE 20 MG PO CPDR: 20.0000 mg | DELAYED_RELEASE_CAPSULE | Freq: Every day | ORAL | 3 refills | Status: DC

## 2018-05-18 MED ORDER — DICLOFENAC SODIUM 1 % TD GEL: 4.0000 g | Freq: Four times a day (QID) | TRANSDERMAL | 12 refills | Status: DC

## 2018-05-18 MED ORDER — ESCITALOPRAM OXALATE 10 MG PO TABS: 10.0000 mg | ORAL_TABLET | Freq: Every day | ORAL | 0 refills | Status: DC

## 2018-05-18 NOTE — Progress Notes (Signed)
Subjective:    I'm seeing this patient as a consultation for:  Carlis Stable, PA-C   CC: Bilateral knee pain  HPI: Krystal Perez notes bilateral left worse than right knee pain ongoing for about 3 months.  She is status post gastric sleeve a few years ago and is exercising regularly and attempt to control weight and improve health.  She is been doing a kickboxing cardio class that requires lots of stepping and lunging.  She notes her knee has been bothering her since starting the class.  She does note the left knee will occasionally swell and is predominantly painful at the medial joint line.  She notes popping and clicking with knee motion bilaterally worse with deep knee bending and squatting.  She denies locking or catching.  She is tried some over-the-counter medications including ibuprofen and Aleve but is not supposed to take NSAIDs regularly due to her gastric sleeve surgery.  She was seen by her PCP who obtain x-rays and is here today for follow-up and evaluation after referral.  Her PCP recently started Lexapro and is currently taking 10 mg daily.  She notes she is feeling great on this and would like to continue it.  Past medical history, Surgical history, Family history not pertinant except as noted below, Social history, Allergies, and medications have been entered into the medical record, reviewed, and no changes needed.   Review of Systems: No headache, visual changes, nausea, vomiting, diarrhea, constipation, dizziness, abdominal pain, skin rash, fevers, chills, night sweats, weight loss, swollen lymph nodes, body aches, joint swelling, muscle aches, chest pain, shortness of breath, mood changes, visual or auditory hallucinations.   Objective:    Vitals:   05/18/18 1305  BP: 130/83  Pulse: 72   General: Well Developed, well nourished, and in no acute distress.  Neuro/Psych: Alert and oriented x3, extra-ocular muscles intact, able to move all 4 extremities, sensation  grossly intact.  Normal speech thought process and affect. Skin: Warm and dry, no rashes noted.  Respiratory: Not using accessory muscles, speaking in full sentences, trachea midline.  Cardiovascular: Pulses palpable, no extremity edema. Abdomen: Does not appear distended. MSK:  Left knee no significant effusion. Range of motion 0-120 degrees with retropatellar crepitations. Stable ligamentous exam. Mildly tender to palpation medial joint line. Mildly positive medial McMurray's test.  Right knee: No significant effusion range of motion 0-120 degrees with retropatellar crepitations. Stable ligamentous exam. Negative McMurray's testing.  Intact flexion and extension strength.  Hips bilaterally normal-appearing normal motion nontender hip abduction strength diminished left side 4/5 normal right 5/5.  Lab and Radiology Results EXAM: RIGHT KNEE - COMPLETE 4+ VIEW  COMPARISON:  None.  FINDINGS: Mild lateral spur formation and mild to moderate medial and patellofemoral spur formation. No effusion.  IMPRESSION: Degenerative changes, as described above.   Electronically Signed   By: Beckie Salts M.D.   On: 04/27/2018 16:00  EXAM: LEFT KNEE - COMPLETE 4+ VIEW  COMPARISON:  None.  FINDINGS: No fracture or malalignment. Mild patellofemoral degenerative change. Small knee effusion. Mild degenerative change of the medial joint space.  IMPRESSION: Mild arthritis with small knee effusion. No acute osseous abnormality.   Electronically Signed   By: Jasmine Pang M.D.   On: 04/27/2018 16:00 I personally (independently) visualized and performed the interpretation of the images attached in this note.   Impression and Recommendations:    Assessment and Plan: 42 y.o. female with  Knee pain bilateral.  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  Somewhat concerned for left knee medial meniscus injury.  Plan for diclofenac gel  modification of exercise and quad and hip abductor strength program.  Plan for recheck in 1 month sooner if needed.  Next step would be injection versus MRI.  Patient additionally is doing well from a mental health standpoint refill Lexapro follow-up with PCP.  Patient was taken over-the-counter omeprazole refilled prescription..   No orders of the defined types were placed in this encounter.  Meds ordered this encounter  Medications  . omeprazole (PRILOSEC) 20 MG capsule    Sig: Take 1 capsule (20 mg total) by mouth daily.    Dispense:  90 capsule    Refill:  3  . diclofenac sodium (VOLTAREN) 1 % GEL    Sig: Apply 4 g topically 4 (four) times daily. To affected joint.    Dispense:  100 g    Refill:  12    Tried Ibuprofen and aleve. Knee arthritis.  Marland Kitchen escitalopram (LEXAPRO) 10 MG tablet    Sig: Take 1 tablet (10 mg total) by mouth at bedtime.    Dispense:  90 tablet    Refill:  0    Discussed warning signs or symptoms. Please see discharge instructions. Patient expresses understanding.

## 2018-05-18 NOTE — Patient Instructions (Addendum)
Thank you for coming in today. Continue to exercise.  Cycling is ok.  Avoid deep step and lunges and squats.   Use the dicofenac gel.   Recheck in 1 month.  Let me know if not better.

## 2018-06-15 ENCOUNTER — Ambulatory Visit: Payer: 59 | Admitting: Family Medicine

## 2018-06-20 ENCOUNTER — Other Ambulatory Visit: Payer: Self-pay | Admitting: Physician Assistant

## 2018-06-20 DIAGNOSIS — F418 Other specified anxiety disorders: Secondary | ICD-10-CM

## 2018-09-08 ENCOUNTER — Other Ambulatory Visit: Payer: Self-pay | Admitting: Family Medicine

## 2018-09-08 DIAGNOSIS — F418 Other specified anxiety disorders: Secondary | ICD-10-CM

## 2018-09-09 ENCOUNTER — Other Ambulatory Visit: Payer: Self-pay | Admitting: Physician Assistant

## 2018-09-18 ENCOUNTER — Other Ambulatory Visit: Payer: Self-pay | Admitting: Physician Assistant

## 2018-09-18 DIAGNOSIS — F418 Other specified anxiety disorders: Secondary | ICD-10-CM

## 2018-09-22 MED ORDER — LORAZEPAM 0.5 MG PO TABS: 0.5000 mg | ORAL_TABLET | Freq: Three times a day (TID) | ORAL | 0 refills | Status: DC | PRN

## 2018-10-10 ENCOUNTER — Other Ambulatory Visit: Payer: Self-pay | Admitting: Physician Assistant

## 2018-10-10 DIAGNOSIS — F418 Other specified anxiety disorders: Secondary | ICD-10-CM

## 2018-10-12 ENCOUNTER — Other Ambulatory Visit: Payer: Self-pay | Admitting: Physician Assistant

## 2018-10-12 DIAGNOSIS — F418 Other specified anxiety disorders: Secondary | ICD-10-CM

## 2018-10-13 ENCOUNTER — Encounter: Payer: Self-pay | Admitting: Physician Assistant

## 2018-10-13 ENCOUNTER — Other Ambulatory Visit: Payer: Self-pay | Admitting: Physician Assistant

## 2018-10-13 DIAGNOSIS — F418 Other specified anxiety disorders: Secondary | ICD-10-CM

## 2018-10-14 ENCOUNTER — Other Ambulatory Visit: Payer: Self-pay | Admitting: *Deleted

## 2018-10-14 DIAGNOSIS — F418 Other specified anxiety disorders: Secondary | ICD-10-CM

## 2018-10-14 MED ORDER — ESCITALOPRAM OXALATE 10 MG PO TABS: 10.0000 mg | ORAL_TABLET | Freq: Every day | ORAL | 0 refills | Status: DC

## 2018-12-09 ENCOUNTER — Telehealth: Payer: Self-pay | Admitting: Physician Assistant

## 2018-12-09 ENCOUNTER — Encounter: Payer: Self-pay | Admitting: Physician Assistant

## 2018-12-09 ENCOUNTER — Telehealth (INDEPENDENT_AMBULATORY_CARE_PROVIDER_SITE_OTHER): Payer: 59 | Admitting: Physician Assistant

## 2018-12-09 DIAGNOSIS — F418 Other specified anxiety disorders: Secondary | ICD-10-CM | POA: Diagnosis not present

## 2018-12-09 DIAGNOSIS — E569 Vitamin deficiency, unspecified: Secondary | ICD-10-CM | POA: Diagnosis not present

## 2018-12-09 DIAGNOSIS — Z903 Acquired absence of stomach [part of]: Secondary | ICD-10-CM | POA: Diagnosis not present

## 2018-12-09 DIAGNOSIS — Z13220 Encounter for screening for lipoid disorders: Secondary | ICD-10-CM

## 2018-12-09 DIAGNOSIS — R1013 Epigastric pain: Secondary | ICD-10-CM

## 2018-12-09 DIAGNOSIS — K319 Disease of stomach and duodenum, unspecified: Secondary | ICD-10-CM | POA: Diagnosis not present

## 2018-12-09 DIAGNOSIS — E6609 Other obesity due to excess calories: Secondary | ICD-10-CM

## 2018-12-09 MED ORDER — ESOMEPRAZOLE MAGNESIUM 40 MG PO CPDR: 40.0000 mg | DELAYED_RELEASE_CAPSULE | Freq: Every evening | ORAL | 1 refills | Status: DC | PRN

## 2018-12-09 MED ORDER — ESCITALOPRAM OXALATE 10 MG PO TABS: 10.0000 mg | ORAL_TABLET | Freq: Every day | ORAL | 1 refills | Status: DC

## 2018-12-09 MED ORDER — LORAZEPAM 0.5 MG PO TABS: 0.5000 mg | ORAL_TABLET | Freq: Once | ORAL | 1 refills | Status: DC | PRN

## 2018-12-09 NOTE — Telephone Encounter (Signed)
Contact insurance to find out what is preferred. She has failed Omeprazole

## 2018-12-09 NOTE — Telephone Encounter (Signed)
Received fax that Nexium is not covered. Routing for alternative

## 2018-12-09 NOTE — Progress Notes (Signed)
Virtual Visit via Video Note  I connected with Krystal Perez on 12/14/18 at  8:50 AM EDT by a video enabled telemedicine application and verified that I am speaking with the correct person using two identifiers.   I discussed the limitations of evaluation and management by telemedicine and the availability of in person appointments. The patient expressed understanding and agreed to proceed.  History of Present Illness: HPI:                                                                Krystal Perez is a 43 y.o. female   CC: anxiety follow-up  She was switched from Fluoxetine to Lexapro approx 6 months ago and this is working well for her Mood: "mellow" Anxiety: reports increased stress at home, has needed Lorazepam more than usual Sleep: restorative  GERD: Omeprazole no longer helping with nighttime heartburn. States she is waking up with regurgitation despite taking PPI before bed. She denies dysphagia, but states she has a stronger gag reflux with certain liquids. She is also using TUMs prn for breakthrough symptoms during the day. She is s/p gastric sleeve (2016) with hx of a leak that healed itself. She denies unintended weight loss, forceful vomiting, melena/hematochezia.  [facility] 2[facility] 12/09/2018 04/27/2018 10/07/2017 06/02/2017  Decreased Interest 0 1 0 1  Down, Depressed, Hopeless 0 2 0 2  PHQ - 2 Score 0 3 0 3  Altered sleeping 0 2 1 1   Tired, decreased energy 2 3 1 1   Change in appetite 0 3 0 1  Feeling bad or failure about yourself  0 1 0 2  Trouble concentrating 2 2 2 1   Moving slowly or fidgety/restless 0 0 0 0  Suicidal thoughts 0 0 0 0  PHQ-9 Score 4 14 4 9   Difficult doing work/chores - Somewhat difficult Somewhat difficult -    GAD 7 : Generalized Anxiety Score 12/09/2018 04/27/2018 10/07/2017 06/02/2017  Nervous, Anxious, on Edge 3 3 1 3   Control/stop worrying 3 2 1 1   Worry too much - different things 3 2 1 1   Trouble relaxing 3 1 1 2   Restless 3 2 1 1    Easily annoyed or irritable 3 3 1 3   Afraid - awful might happen 0 2 0 0  Total GAD 7 Score 18 15 6 11   Anxiety Difficulty - Somewhat difficult Somewhat difficult -      Past Medical History:  Diagnosis Date  . Anxiety   . Depression   . HPV test positive   . Hypertension   . Menorrhagia   . Morbid obesity (HCC)   . Obesity    Past Surgical History:  Procedure Laterality Date  . CYST EXCISION    . ENDOMETRIAL ABLATION  2014  . ENDOMETRIAL ABLATION    . LAPAROSCOPIC GASTRIC SLEEVE RESECTION    . TUBAL LIGATION  2014   Social History   Tobacco Use  . Smoking status: Never Smoker  . Smokeless tobacco: Never Used  Substance Use Topics  . Alcohol use: Yes    Alcohol/week: 0.0 standard drinks    Comment: Occasionally   family history includes Cancer in her maternal grandfather; Diabetes in her maternal grandfather and mother; Hyperlipidemia in her mother; Hypertension in her father and mother.  ROS: negative except as noted in the HPI  Medications: Current Outpatient Medications  Medication Sig Dispense Refill  . Black Pepper-Turmeric 3-500 MG CAPS Take 2 capsules by mouth at bedtime.    . Collagen-Vitamin C (COLLAGEN PLUS VITAMIN C PO) Take 2 tablets by mouth daily.    Marland Kitchen escitalopram (LEXAPRO) 10 MG tablet Take 1 tablet (10 mg total) by mouth at bedtime. Needs appt 90 tablet 1  . Multiple Vitamin (MULTIVITAMIN) tablet Take 1 tablet by mouth daily.    Marland Kitchen esomeprazole (NEXIUM) 40 MG capsule Take 1 capsule (40 mg total) by mouth at bedtime as needed (heartburn/indigestion). 90 capsule 1  . LORazepam (ATIVAN) 0.5 MG tablet Take 1 tablet (0.5 mg total) by mouth once as needed for up to 1 dose for anxiety. 20 tablet 1  . Omega-3 Fatty Acids (FISH OIL) 1000 MG CAPS Take 2 capsules by mouth at bedtime.     No current facility-administered medications for this visit.    Allergies  Allergen Reactions  . Celexa [Citalopram Hydrobromide] Other (See Comments)  . Fluoxetine  Other (See Comments)    Sexual side effects       Objective:  There were no vitals filed for this visit. Gen:  alert, not ill-appearing, no distress, appropriate for age HEENT: head normocephalic without obvious abnormality, conjunctiva and cornea clear, trachea midline Pulm: Normal work of breathing, normal phonation Neuro: alert and oriented x 3 Psych: cooperative, euthymic mood, affect mood-congruent, speech is articulate, normal rate and volume; thought processes clear and goal-directed, normal judgment, good insight  BP Readings from Last 3 Encounters:  05/18/18 130/83  04/27/18 115/74  10/07/17 121/81   Wt Readings from Last 3 Encounters:  05/18/18 182 lb (82.6 kg)  04/27/18 183 lb (83 kg)  10/07/17 175 lb (79.4 kg)     No results found for this or any previous visit (from the past 72 hour(s)). No results found.    Assessment and Plan: 43 y.o. female with   .Krystal Perez was seen today for medication management.  Diagnoses and all orders for this visit:  Mixed anxiety and depressive disorder -     escitalopram (LEXAPRO) 10 MG tablet; Take 1 tablet (10 mg total) by mouth at bedtime. Needs appt -     Discontinue: LORazepam (ATIVAN) 0.5 MG tablet; Take 1 tablet (0.5 mg total) by mouth once as needed for up to 1 dose for anxiety.  Dyspepsia and disorder of function of stomach -     esomeprazole (NEXIUM) 40 MG capsule; Take 1 capsule (40 mg total) by mouth at bedtime as needed (heartburn/indigestion). -     COMPLETE METABOLIC PANEL WITH GFR -     CBC with Differential/Platelet -     Lipase  History of sleeve gastrectomy -     COMPLETE METABOLIC PANEL WITH GFR -     CBC with Differential/Platelet -     Ferritin -     Vitamin A -     VITAMIN D 25 Hydroxy (Vit-D Deficiency, Fractures) -     Vitamin B12  Vitamin deficiency -     Vitamin A -     VITAMIN D 25 Hydroxy (Vit-D Deficiency, Fractures) -     Vitamin B12  Screening for lipid disorders -     Lipid Panel  w/reflex Direct LDL  Class 1 obesity due to excess calories in adult, unspecified BMI, unspecified whether serious comorbidity present -     Lipid Panel w/reflex Direct LDL  Mixed anxiety and  depression PHQ9=4, well controlled on Lexapro 10 mg GAD7=18, slightly worsened from 15, 6 months ago. She feels this is situational and does not want to increase her Lexapro Continue low-dose Lorazepam prn for breakthrough anxiety/panic. Checked PDMP. No red flags  Dyspepsia Switching from OTC Prilosec to Nexium 40 mg QD for 2-4 weeks Given her hx of gastric sleeve she is at risk for PUD and I would recommend f/u with GI if symptoms do not improve with high dose PPI trial She is also overdue for her routine lab work to monitor s/p gastric sleeve  Follow-up in 2-4 weeks for dyspepsia or sooner as needed   Follow Up Instructions:    I discussed the assessment and treatment plan with the patient. The patient was provided an opportunity to ask questions and all were answered. The patient agreed with the plan and demonstrated an understanding of the instructions.   The patient was advised to call back or seek an in-person evaluation if the symptoms worsen or if the condition fails to improve as anticipated.  I provided approx 10 minutes of non-face-to-face time during this encounter.   Carlis Stable, [place]

## 2018-12-10 NOTE — Telephone Encounter (Signed)
Patient decided to change pharmacies and use [facility] If needed in the future, will change to preferred agent

## 2018-12-10 NOTE — Telephone Encounter (Signed)
ESOMEPRAZOLE MAGNESIUM CAP DELAYED RELEASE 40 MG is the preferred product. Please advise.

## 2018-12-14 DIAGNOSIS — R1013 Epigastric pain: Secondary | ICD-10-CM | POA: Insufficient documentation

## 2018-12-14 DIAGNOSIS — K319 Disease of stomach and duodenum, unspecified: Secondary | ICD-10-CM | POA: Insufficient documentation

## 2019-03-01 ENCOUNTER — Other Ambulatory Visit: Payer: Self-pay | Admitting: Physician Assistant

## 2019-03-01 DIAGNOSIS — K319 Disease of stomach and duodenum, unspecified: Secondary | ICD-10-CM

## 2019-03-01 DIAGNOSIS — R1013 Epigastric pain: Secondary | ICD-10-CM

## 2019-04-06 ENCOUNTER — Other Ambulatory Visit: Payer: Self-pay | Admitting: Physician Assistant

## 2019-04-06 DIAGNOSIS — R1013 Epigastric pain: Secondary | ICD-10-CM

## 2019-04-06 DIAGNOSIS — K319 Disease of stomach and duodenum, unspecified: Secondary | ICD-10-CM

## 2019-05-04 ENCOUNTER — Other Ambulatory Visit: Payer: Self-pay | Admitting: Physician Assistant

## 2019-05-04 ENCOUNTER — Encounter: Payer: Self-pay | Admitting: Physician Assistant

## 2019-05-04 DIAGNOSIS — K319 Disease of stomach and duodenum, unspecified: Secondary | ICD-10-CM

## 2019-05-04 DIAGNOSIS — F418 Other specified anxiety disorders: Secondary | ICD-10-CM

## 2019-05-04 DIAGNOSIS — R1013 Epigastric pain: Secondary | ICD-10-CM

## 2019-05-04 MED ORDER — ESOMEPRAZOLE MAGNESIUM 40 MG PO CPDR: 40.0000 mg | DELAYED_RELEASE_CAPSULE | Freq: Every day | ORAL | 0 refills | Status: DC

## 2019-05-05 MED ORDER — ESCITALOPRAM OXALATE 10 MG PO TABS: 10.0000 mg | ORAL_TABLET | Freq: Every day | ORAL | 0 refills | Status: DC

## 2019-05-14 ENCOUNTER — Telehealth (INDEPENDENT_AMBULATORY_CARE_PROVIDER_SITE_OTHER): Payer: 59 | Admitting: Family Medicine

## 2019-05-14 VITALS — HR 81 | Wt 172.4 lb

## 2019-05-14 DIAGNOSIS — K319 Disease of stomach and duodenum, unspecified: Secondary | ICD-10-CM | POA: Diagnosis not present

## 2019-05-14 DIAGNOSIS — Z903 Acquired absence of stomach [part of]: Secondary | ICD-10-CM | POA: Diagnosis not present

## 2019-05-14 DIAGNOSIS — F418 Other specified anxiety disorders: Secondary | ICD-10-CM

## 2019-05-14 DIAGNOSIS — R1013 Epigastric pain: Secondary | ICD-10-CM

## 2019-05-14 DIAGNOSIS — Z1231 Encounter for screening mammogram for malignant neoplasm of breast: Secondary | ICD-10-CM | POA: Diagnosis not present

## 2019-05-14 MED ORDER — LORAZEPAM 0.5 MG PO TABS: 0.5000 mg | ORAL_TABLET | Freq: Once | ORAL | 1 refills | Status: AC | PRN

## 2019-05-14 MED ORDER — ESCITALOPRAM OXALATE 10 MG PO TABS: 10.0000 mg | ORAL_TABLET | Freq: Every day | ORAL | 3 refills | Status: AC

## 2019-05-14 MED ORDER — ESOMEPRAZOLE MAGNESIUM 40 MG PO CPDR: 40.0000 mg | DELAYED_RELEASE_CAPSULE | Freq: Every day | ORAL | 3 refills | Status: AC

## 2019-05-14 NOTE — Progress Notes (Signed)
Virtual Visit  via Video Note  I connected with      Krystal Perez by a video enabled telemedicine application and verified that I am speaking with the correct person using two identifiers.   I discussed the limitations of evaluation and management by telemedicine and the availability of in person appointments. The patient expressed understanding and agreed to proceed.  History of Present Illness: Krystal Perez is a 43 y.o. female who would like to discuss follow-up mood and GERD.  Patient has a history of anxiety and depression currently well managed with Lexapro.  She is very happy with how its going.  She does have a prescription for Ativan which she uses very infrequently.  Additionally she has a history of acid reflux and a surgical history for gastric sleeve.  She notes that she is had a little bit of weight gain and had worsening acid reflux.  She currently is taking Nexium and would like to continue to take it for now.  She notes Nexium works quite well but she is working to lose weight to reduce her need for PPI for acid reflux.     Observations/Objective: Pulse 81   Wt 172 lb 6.4 oz (78.2 kg)   BMI 31.53 kg/m  Wt Readings from Last 5 Encounters:  05/14/19 172 lb 6.4 oz (78.2 kg)  05/18/18 182 lb (82.6 kg)  04/27/18 183 lb (83 kg)  10/07/17 175 lb (79.4 kg)  06/02/17 179 lb 4 oz (81.3 kg)   Exam: Appearance nontoxic appearing Normal Speech.  Psych alert and oriented normal speech thought process and affect.  Depression screen [facility] 2/9 05/14/2019 12/09/2018 04/27/2018 10/07/2017 06/02/2017  Decreased Interest 0 0 1 0 1  Down, Depressed, Hopeless 0 0 2 0 2  PHQ - 2 Score 0 0 3 0 3  Altered sleeping 0 0 2 1 1   Tired, decreased energy 0 2 3 1 1   Change in appetite 0 0 3 0 1  Feeling bad or failure about yourself  0 0 1 0 2  Trouble concentrating 0 2 2 2 1   Moving slowly or fidgety/restless 0 0 0 0 0  Suicidal thoughts 0 0 0 0 0  PHQ-9 Score 0 4 14 4 9   Difficult doing  work/chores Not difficult at all - Somewhat difficult Somewhat difficult -      Assessment and Plan: 43 y.o. female with  Mood: Much improved.  Continue Lexapro at current dose.  Limited Ativan refilled use as needed sparingly.  Recheck 6 to 12 months new PCP.  GERD: Doing well work on weight loss as above.  Continue Nexium wean off if able.  Discussed health maintenance.  Discussed mammogram.  Order mammogram now. Schedule with Dr. Sheppard Coil in February for well woman visit and Pap smear.  PDMP reviewed during this encounter. Orders Placed This Encounter  Procedures  . MM 3D SCREEN BREAST BILATERAL    Standing Status:   Future    Standing Expiration Date:   07/13/2020    Order Specific Question:   Reason for Exam (SYMPTOM  OR DIAGNOSIS REQUIRED)    Answer:   screen breast cancer    Order Specific Question:   Is the patient pregnant?    Answer:   No    Order Specific Question:   Preferred imaging location?    Answer:   [facility]   Meds ordered this encounter  Medications  . escitalopram (LEXAPRO) 10 MG tablet    Sig: Take 1  tablet (10 mg total) by mouth at bedtime.    Dispense:  90 tablet    Refill:  3  . esomeprazole (NEXIUM) 40 MG capsule    Sig: Take 1 capsule (40 mg total) by mouth at bedtime.    Dispense:  90 capsule    Refill:  3  . LORazepam (ATIVAN) 0.5 MG tablet    Sig: Take 1 tablet (0.5 mg total) by mouth once as needed for up to 1 dose for anxiety.    Dispense:  20 tablet    Refill:  1    Pt will call if needing refill.    Follow Up Instructions:    I discussed the assessment and treatment plan with the patient. The patient was provided an opportunity to ask questions and all were answered. The patient agreed with the plan and demonstrated an understanding of the instructions.   The patient was advised to call back or seek an in-person evaluation if the symptoms worsen or if the condition fails to improve as anticipated.  Time: 15 minutes  of intraservice time, with >22 minutes of total time during today's visit.      Historical information moved to improve visibility of documentation.  Past Medical History:  Diagnosis Date  . Anxiety   . Depression   . HPV test positive   . Hypertension   . Menorrhagia   . Morbid obesity (HCC)   . Obesity    Past Surgical History:  Procedure Laterality Date  . CYST EXCISION    . ENDOMETRIAL ABLATION  2014  . ENDOMETRIAL ABLATION    . LAPAROSCOPIC GASTRIC SLEEVE RESECTION    . TUBAL LIGATION  2014   Social History   Tobacco Use  . Smoking status: Never Smoker  . Smokeless tobacco: Never Used  Substance Use Topics  . Alcohol use: Yes    Alcohol/week: 0.0 standard drinks    Comment: Occasionally   family history includes Cancer in her maternal grandfather; Diabetes in her maternal grandfather and mother; Hyperlipidemia in her mother; Hypertension in her father and mother.  Medications: Current Outpatient Medications  Medication Sig Dispense Refill  . Black Pepper-Turmeric 3-500 MG CAPS Take 2 capsules by mouth at bedtime.    . Collagen-Vitamin C (COLLAGEN PLUS VITAMIN C PO) Take 2 tablets by mouth daily.    Marland Kitchen escitalopram (LEXAPRO) 10 MG tablet Take 1 tablet (10 mg total) by mouth at bedtime. 90 tablet 3  . esomeprazole (NEXIUM) 40 MG capsule Take 1 capsule (40 mg total) by mouth at bedtime. 90 capsule 3  . LORazepam (ATIVAN) 0.5 MG tablet Take 1 tablet (0.5 mg total) by mouth once as needed for up to 1 dose for anxiety. 20 tablet 1  . Multiple Vitamin (MULTIVITAMIN) tablet Take 1 tablet by mouth daily.    . Omega-3 Fatty Acids (FISH OIL) 1000 MG CAPS Take 2 capsules by mouth at bedtime.     No current facility-administered medications for this visit.    Allergies  Allergen Reactions  . Celexa [Citalopram Hydrobromide] Other (See Comments)  . Fluoxetine Other (See Comments)    Sexual side effects

## 2019-05-14 NOTE — Patient Instructions (Addendum)
Thank you for coming in today. Medicine refilled.  Schedule with Dr Sheppard Coil on or after Feb 1st for well woman visit with pap smear.

## 2019-08-31 ENCOUNTER — Encounter: Payer: 59 | Admitting: Osteopathic Medicine

## 2019-10-05 ENCOUNTER — Encounter: Payer: Self-pay | Admitting: Medical-Surgical

## 2020-03-19 IMAGING — DX DG KNEE COMPLETE 4+V*L*
4 series · 4 of 4 positions shown · non-contrast
Comparison: None.

CLINICAL DATA: Knee pain for 2 months

EXAM:
LEFT KNEE - COMPLETE 4+ VIEW

[knee ap]
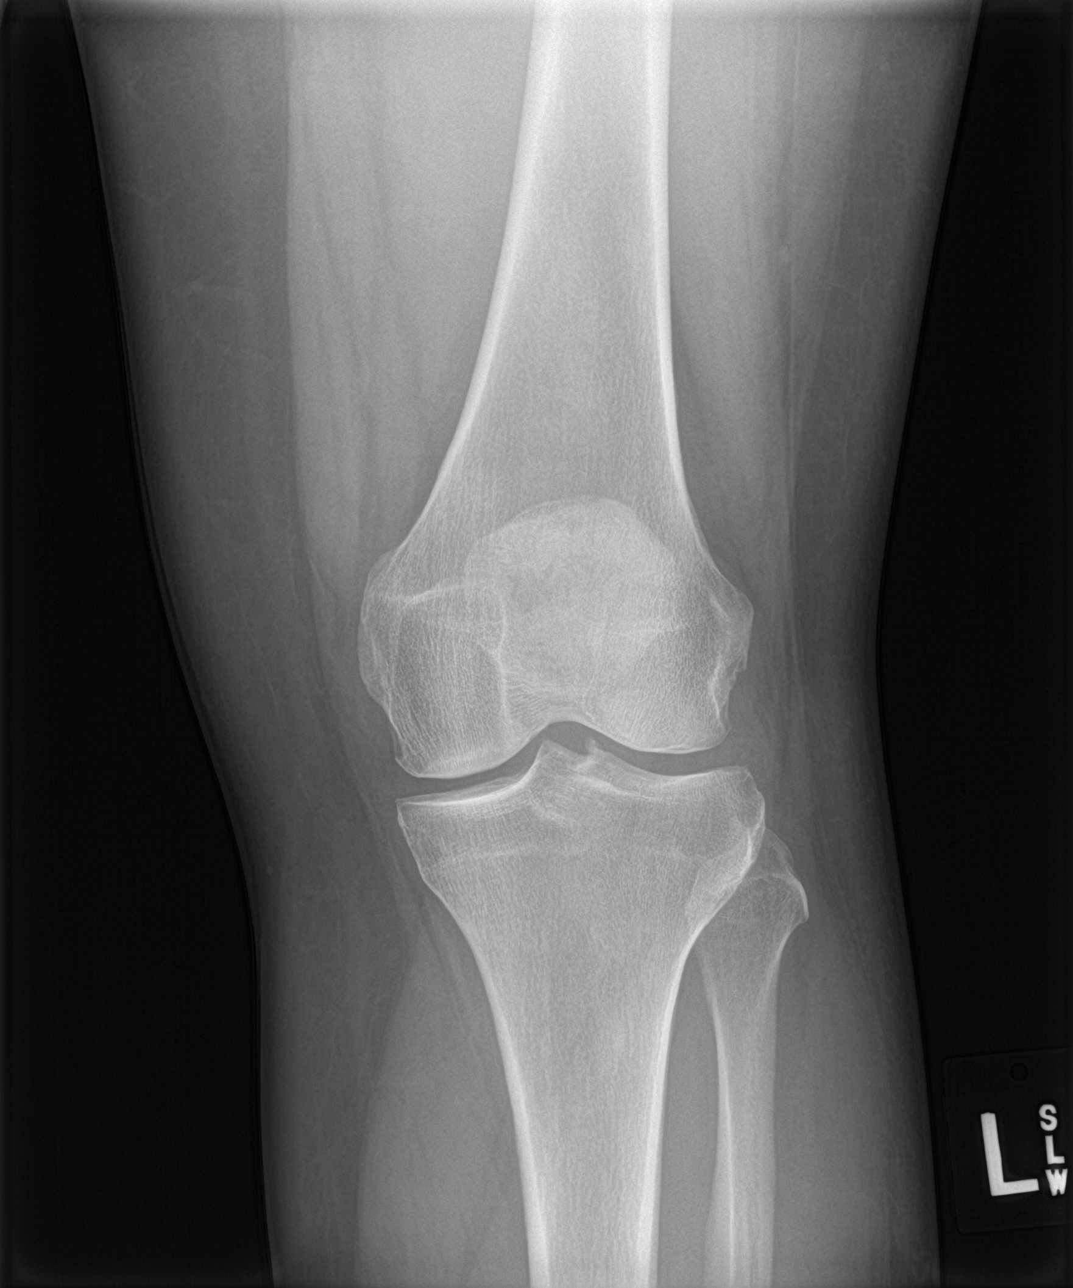

[knee lat]
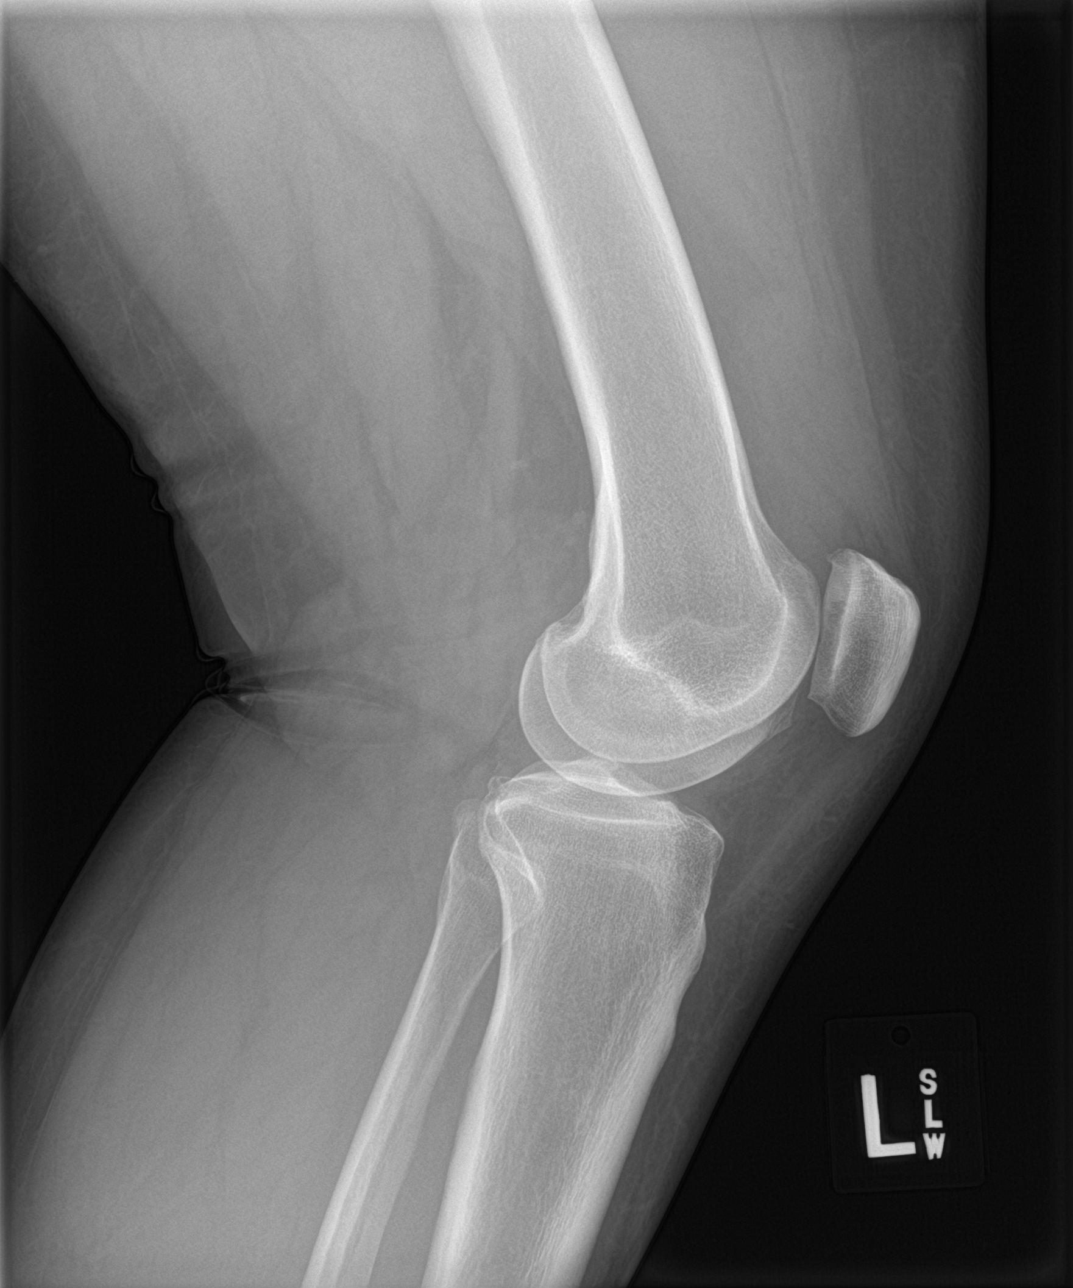

[knee obl (1 of 2)]
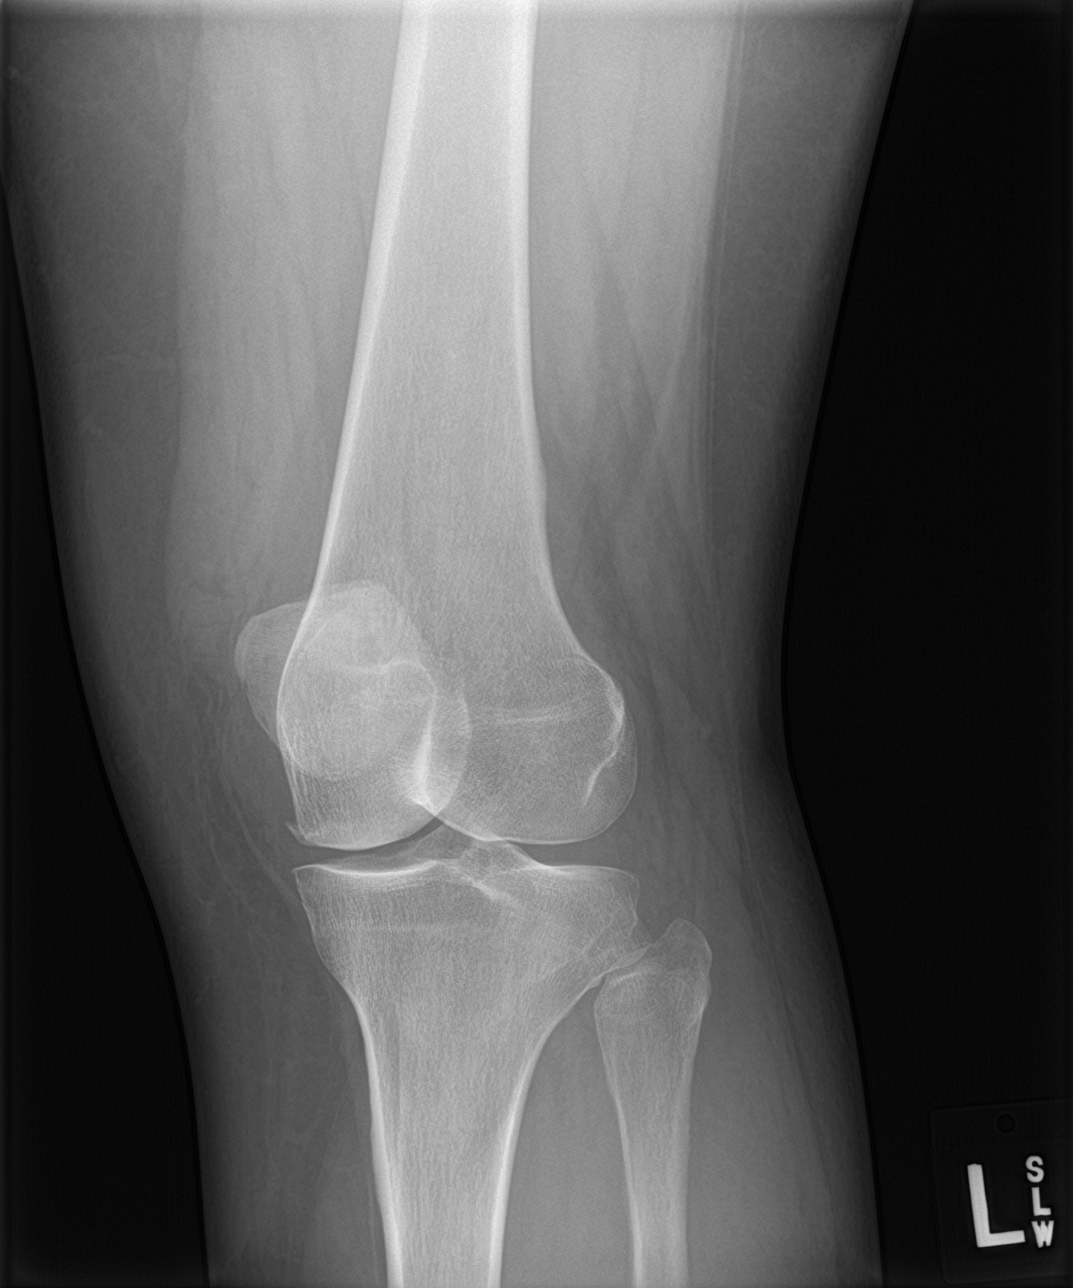

[knee obl (2 of 2)]
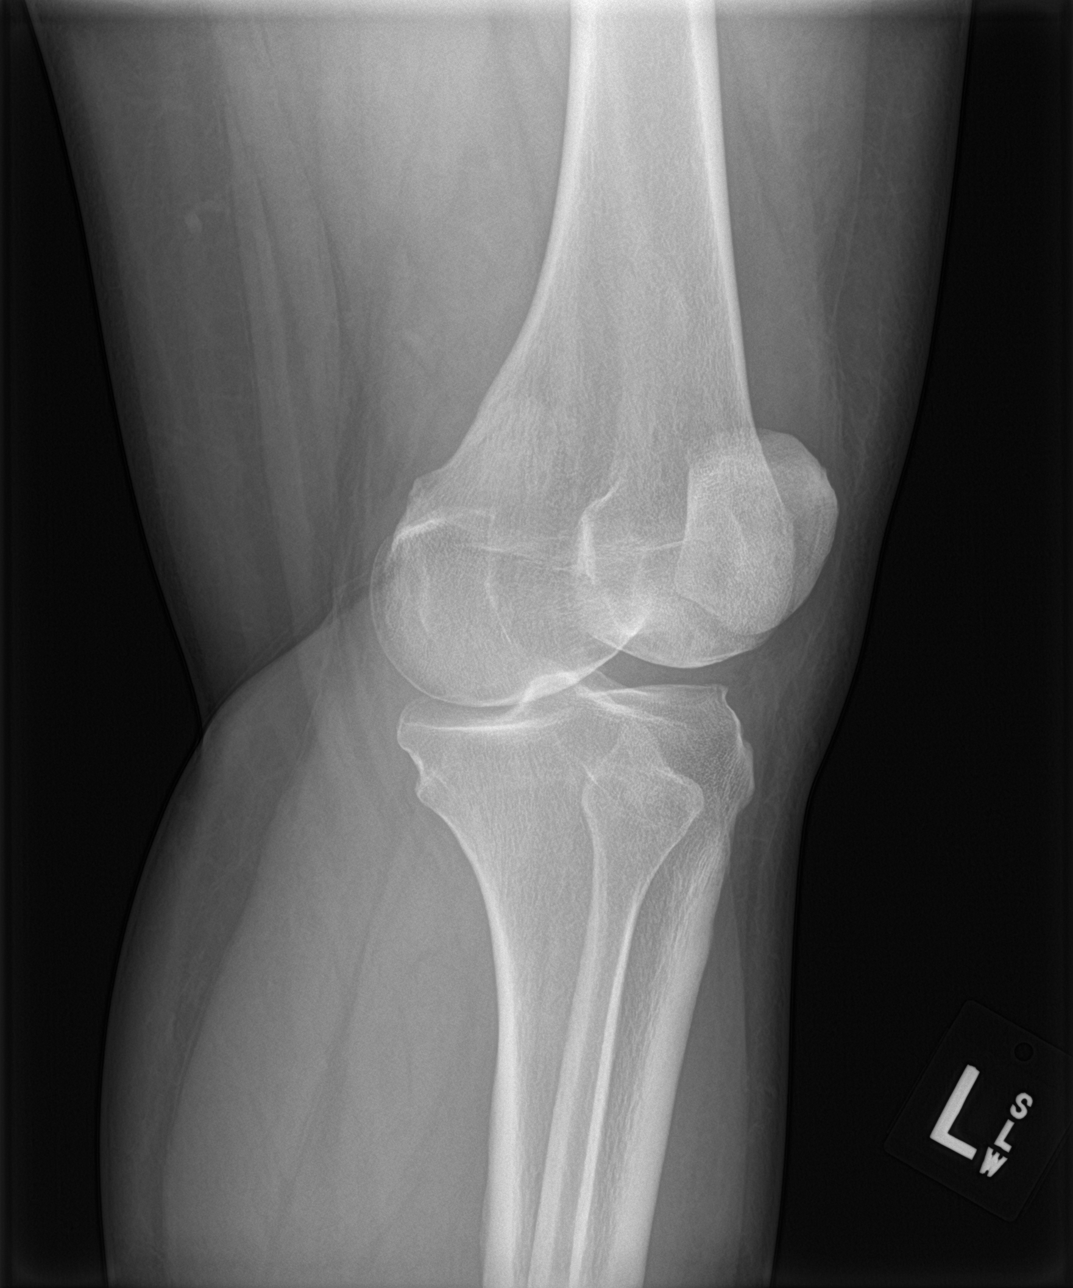

[4 of 4 positions shown; findings below may reference images not displayed]

FINDINGS: No fracture or malalignment. Mild patellofemoral degenerative
change. Small knee effusion. Mild degenerative change of the medial
joint space.
IMPRESSION: Mild arthritis with small knee effusion. No acute osseous
abnormality.

## 2020-11-16 ENCOUNTER — Other Ambulatory Visit: Payer: Self-pay | Admitting: Family Medicine

## 2020-11-16 DIAGNOSIS — K319 Disease of stomach and duodenum, unspecified: Secondary | ICD-10-CM

## 2020-11-16 NOTE — Telephone Encounter (Signed)
Rx refill request should be sent to pt's PCP

## 2024-04-21 ENCOUNTER — Other Ambulatory Visit: Payer: Self-pay | Admitting: Medical Genetics
